# Patient Record
Sex: Male | Born: 1965
Health system: Southern US, Community
[De-identification: ages and names within clinical notes are randomized; demographics above are authoritative.]

## PROBLEM LIST (undated history)

## (undated) HISTORY — PX: HERNIA REPAIR: SHX51

## (undated) HISTORY — PX: APPENDECTOMY: SHX54

---

## 1997-09-19 ENCOUNTER — Emergency Department (HOSPITAL_COMMUNITY): Admission: EM | Admit: 1997-09-19 | Discharge: 1997-09-19 | Payer: Self-pay | Admitting: Emergency Medicine

## 1998-08-03 ENCOUNTER — Ambulatory Visit (HOSPITAL_BASED_OUTPATIENT_CLINIC_OR_DEPARTMENT_OTHER): Admission: RE | Admit: 1998-08-03 | Discharge: 1998-08-03 | Payer: Self-pay | Admitting: Orthopedic Surgery

## 2004-04-14 ENCOUNTER — Emergency Department (HOSPITAL_COMMUNITY): Admission: EM | Admit: 2004-04-14 | Discharge: 2004-04-14 | Payer: Self-pay | Admitting: Emergency Medicine

## 2007-02-10 ENCOUNTER — Emergency Department (HOSPITAL_COMMUNITY): Admission: EM | Admit: 2007-02-10 | Discharge: 2007-02-10 | Payer: Self-pay | Admitting: Emergency Medicine

## 2007-04-29 ENCOUNTER — Emergency Department (HOSPITAL_COMMUNITY): Admission: EM | Admit: 2007-04-29 | Discharge: 2007-04-29 | Payer: Self-pay | Admitting: Emergency Medicine

## 2008-10-18 ENCOUNTER — Emergency Department (HOSPITAL_COMMUNITY): Admission: EM | Admit: 2008-10-18 | Discharge: 2008-10-18 | Payer: Self-pay | Admitting: Emergency Medicine

## 2009-09-24 ENCOUNTER — Encounter: Admission: RE | Admit: 2009-09-24 | Discharge: 2009-09-24 | Payer: Self-pay | Admitting: Chiropractic Medicine

## 2010-03-06 ENCOUNTER — Emergency Department (HOSPITAL_COMMUNITY)
Admission: EM | Admit: 2010-03-06 | Discharge: 2010-03-06 | Payer: Self-pay | Source: Home / Self Care | Admitting: Emergency Medicine

## 2010-03-08 ENCOUNTER — Emergency Department (HOSPITAL_COMMUNITY)
Admission: EM | Admit: 2010-03-08 | Discharge: 2010-03-09 | Payer: Self-pay | Source: Home / Self Care | Admitting: Emergency Medicine

## 2010-05-30 LAB — URINALYSIS, ROUTINE W REFLEX MICROSCOPIC
Glucose, UA: NEGATIVE mg/dL
Glucose, UA: NEGATIVE mg/dL
Ketones, ur: NEGATIVE mg/dL
Leukocytes, UA: NEGATIVE
Leukocytes, UA: NEGATIVE
Protein, ur: NEGATIVE mg/dL
Specific Gravity, Urine: 1.026 (ref 1.005–1.030)
Urobilinogen, UA: 0.2 mg/dL (ref 0.0–1.0)
pH: 5.5 (ref 5.0–8.0)

## 2010-05-30 LAB — POCT I-STAT, CHEM 8
BUN: 13 mg/dL (ref 6–23)
Calcium, Ion: 1.18 mmol/L (ref 1.12–1.32)
HCT: 40 % (ref 39.0–52.0)
Hemoglobin: 13.6 g/dL (ref 13.0–17.0)
Hemoglobin: 13.9 g/dL (ref 13.0–17.0)
Potassium: 3.9 mEq/L (ref 3.5–5.1)
Sodium: 137 mEq/L (ref 135–145)
Sodium: 141 mEq/L (ref 135–145)
TCO2: 28 mmol/L (ref 0–100)

## 2010-05-30 LAB — DIFFERENTIAL
Basophils Relative: 0 % (ref 0–1)
Eosinophils Absolute: 0.1 10*3/uL (ref 0.0–0.7)
Neutrophils Relative %: 74 % (ref 43–77)

## 2010-05-30 LAB — CBC
MCH: 29.6 pg (ref 26.0–34.0)
MCHC: 34.4 g/dL (ref 30.0–36.0)
Platelets: 154 10*3/uL (ref 150–400)
RBC: 4.52 MIL/uL (ref 4.22–5.81)

## 2010-05-30 LAB — URINE CULTURE
Colony Count: NO GROWTH
Culture: NO GROWTH

## 2010-05-30 LAB — URINE MICROSCOPIC-ADD ON

## 2010-12-09 LAB — CBC
HCT: 42
Hemoglobin: 14.2
Platelets: 216
WBC: 7.3

## 2010-12-09 LAB — BASIC METABOLIC PANEL
GFR calc Af Amer: >60
GFR calc non Af Amer: >60
Potassium: 3.9
Sodium: 140

## 2010-12-09 LAB — DIFFERENTIAL
Eosinophils Relative: 2
Lymphocytes Relative: 33
Lymphs Abs: 2.4
Monocytes Absolute: 0.7

## 2017-09-18 DIAGNOSIS — M545 Low back pain: Secondary | ICD-10-CM | POA: Diagnosis not present

## 2017-11-21 DIAGNOSIS — Z0000 Encounter for general adult medical examination without abnormal findings: Secondary | ICD-10-CM | POA: Diagnosis not present

## 2017-11-21 DIAGNOSIS — Z125 Encounter for screening for malignant neoplasm of prostate: Secondary | ICD-10-CM | POA: Diagnosis not present

## 2017-11-21 DIAGNOSIS — R5383 Other fatigue: Secondary | ICD-10-CM | POA: Diagnosis not present

## 2018-01-18 DIAGNOSIS — H5203 Hypermetropia, bilateral: Secondary | ICD-10-CM | POA: Diagnosis not present

## 2018-01-18 DIAGNOSIS — H0100B Unspecified blepharitis left eye, upper and lower eyelids: Secondary | ICD-10-CM | POA: Diagnosis not present

## 2018-01-18 DIAGNOSIS — H524 Presbyopia: Secondary | ICD-10-CM | POA: Diagnosis not present

## 2018-01-18 DIAGNOSIS — H0100A Unspecified blepharitis right eye, upper and lower eyelids: Secondary | ICD-10-CM | POA: Diagnosis not present

## 2018-01-24 DIAGNOSIS — M79671 Pain in right foot: Secondary | ICD-10-CM | POA: Diagnosis not present

## 2018-01-24 DIAGNOSIS — M79672 Pain in left foot: Secondary | ICD-10-CM | POA: Diagnosis not present

## 2018-01-24 DIAGNOSIS — M722 Plantar fascial fibromatosis: Secondary | ICD-10-CM | POA: Diagnosis not present

## 2018-05-28 DIAGNOSIS — Z1211 Encounter for screening for malignant neoplasm of colon: Secondary | ICD-10-CM | POA: Diagnosis not present

## 2018-05-28 DIAGNOSIS — E669 Obesity, unspecified: Secondary | ICD-10-CM | POA: Diagnosis not present

## 2018-05-28 DIAGNOSIS — Z800 Family history of malignant neoplasm of digestive organs: Secondary | ICD-10-CM | POA: Diagnosis not present

## 2018-06-07 DIAGNOSIS — K5140 Inflammatory polyps of colon without complications: Secondary | ICD-10-CM | POA: Diagnosis not present

## 2018-06-07 DIAGNOSIS — D120 Benign neoplasm of cecum: Secondary | ICD-10-CM | POA: Diagnosis not present

## 2018-06-07 DIAGNOSIS — K635 Polyp of colon: Secondary | ICD-10-CM | POA: Diagnosis not present

## 2018-06-07 DIAGNOSIS — Z1211 Encounter for screening for malignant neoplasm of colon: Secondary | ICD-10-CM | POA: Diagnosis not present

## 2018-06-07 DIAGNOSIS — K5730 Diverticulosis of large intestine without perforation or abscess without bleeding: Secondary | ICD-10-CM | POA: Diagnosis not present

## 2018-09-11 DIAGNOSIS — M545 Low back pain: Secondary | ICD-10-CM | POA: Diagnosis not present

## 2018-09-11 DIAGNOSIS — M62830 Muscle spasm of back: Secondary | ICD-10-CM | POA: Diagnosis not present

## 2018-09-11 DIAGNOSIS — M6281 Muscle weakness (generalized): Secondary | ICD-10-CM | POA: Diagnosis not present

## 2018-09-12 DIAGNOSIS — M6281 Muscle weakness (generalized): Secondary | ICD-10-CM | POA: Diagnosis not present

## 2018-09-12 DIAGNOSIS — M62830 Muscle spasm of back: Secondary | ICD-10-CM | POA: Diagnosis not present

## 2018-09-12 DIAGNOSIS — M545 Low back pain: Secondary | ICD-10-CM | POA: Diagnosis not present

## 2018-09-13 DIAGNOSIS — M6281 Muscle weakness (generalized): Secondary | ICD-10-CM | POA: Diagnosis not present

## 2018-09-13 DIAGNOSIS — M545 Low back pain: Secondary | ICD-10-CM | POA: Diagnosis not present

## 2018-09-13 DIAGNOSIS — M62830 Muscle spasm of back: Secondary | ICD-10-CM | POA: Diagnosis not present

## 2018-09-16 DIAGNOSIS — M62830 Muscle spasm of back: Secondary | ICD-10-CM | POA: Diagnosis not present

## 2018-09-16 DIAGNOSIS — M6281 Muscle weakness (generalized): Secondary | ICD-10-CM | POA: Diagnosis not present

## 2018-09-16 DIAGNOSIS — M545 Low back pain: Secondary | ICD-10-CM | POA: Diagnosis not present

## 2018-09-24 DIAGNOSIS — M545 Low back pain: Secondary | ICD-10-CM | POA: Diagnosis not present

## 2018-09-24 DIAGNOSIS — M62830 Muscle spasm of back: Secondary | ICD-10-CM | POA: Diagnosis not present

## 2018-09-24 DIAGNOSIS — M6281 Muscle weakness (generalized): Secondary | ICD-10-CM | POA: Diagnosis not present

## 2018-11-28 DIAGNOSIS — Z23 Encounter for immunization: Secondary | ICD-10-CM | POA: Diagnosis not present

## 2018-11-28 DIAGNOSIS — E782 Mixed hyperlipidemia: Secondary | ICD-10-CM | POA: Diagnosis not present

## 2018-11-28 DIAGNOSIS — Z0000 Encounter for general adult medical examination without abnormal findings: Secondary | ICD-10-CM | POA: Diagnosis not present

## 2018-11-28 DIAGNOSIS — Z125 Encounter for screening for malignant neoplasm of prostate: Secondary | ICD-10-CM | POA: Diagnosis not present

## 2018-12-10 DIAGNOSIS — G43019 Migraine without aura, intractable, without status migrainosus: Secondary | ICD-10-CM | POA: Diagnosis not present

## 2018-12-10 DIAGNOSIS — G43719 Chronic migraine without aura, intractable, without status migrainosus: Secondary | ICD-10-CM | POA: Diagnosis not present

## 2018-12-25 ENCOUNTER — Encounter: Payer: Self-pay | Admitting: Pulmonary Disease

## 2018-12-25 ENCOUNTER — Ambulatory Visit (INDEPENDENT_AMBULATORY_CARE_PROVIDER_SITE_OTHER): Payer: BC Managed Care – PPO | Admitting: Pulmonary Disease

## 2018-12-25 ENCOUNTER — Other Ambulatory Visit: Payer: Self-pay

## 2018-12-25 VITALS — BP 128/70 | HR 83 | Ht 68.0 in | Wt 199.6 lb

## 2018-12-25 DIAGNOSIS — G4733 Obstructive sleep apnea (adult) (pediatric): Secondary | ICD-10-CM | POA: Diagnosis not present

## 2018-12-25 DIAGNOSIS — Z9989 Dependence on other enabling machines and devices: Secondary | ICD-10-CM

## 2018-12-25 NOTE — Progress Notes (Signed)
Subjective:     Patient ID: Dale Colon, male   DOB: August 21, 1965, 53 y.o.   MRN: [id number]  Patient being seen for obstructive sleep apnea  Patient had a sleep study done over 10 years ago Showing mild obstructive sleep apnea, was started on CPAP therapy Pressure was adjusted to his comfort  Ultimately patient ended up on an auto titrating machine which was better tolerated His current machine is at least 54 years old  We do not have compliance data on this  Sleep is nonrestorative Goes to bed between 930 and 11:30 PM Takes him about 30 minutes to fall asleep Wakes up about once or twice during the night Final awakening time between 630 and 8 AM  Sleep is nonrestorative  He still does have occasional headaches but overall better  He feels he does move around a lot in bed He has a dog that you are doing well recently-this may contribute to nocturnal awakenings    Review of Systems  Constitutional: Negative for fever and unexpected weight change.  HENT: Negative for congestion, dental problem, ear pain, nosebleeds, postnasal drip, rhinorrhea, sinus pressure, sneezing, sore throat and trouble swallowing.   Eyes: Negative for redness and itching.  Respiratory: Negative for cough, chest tightness, shortness of breath and wheezing.   Cardiovascular: Negative for palpitations and leg swelling.  Gastrointestinal: Negative for nausea and vomiting.  Genitourinary: Negative for dysuria.  Musculoskeletal: Negative for joint swelling.  Skin: Negative for rash.  Allergic/Immunologic: Positive for environmental allergies. Negative for food allergies and immunocompromised state.  Neurological: Positive for headaches.  Hematological: Does not bruise/bleed easily.  Psychiatric/Behavioral: Negative for dysphoric mood. The patient is not nervous/anxious.    Medical history significant for chronic headaches  No significant family history  Never smoker  No significant family  history    Objective:   Physical Exam Constitutional:      Appearance: Normal appearance.  HENT:     Head: Normocephalic and atraumatic.     Mouth/Throat:     Pharynx: No oropharyngeal exudate.  Eyes:     General:        Right eye: No discharge.        Left eye: No discharge.     Pupils: Pupils are equal, round, and reactive to light.  Neck:     Musculoskeletal: Normal range of motion and neck supple.  Cardiovascular:     Rate and Rhythm: Normal rate and regular rhythm.     Pulses: Normal pulses.     Heart sounds: Normal heart sounds.  Pulmonary:     Effort: Pulmonary effort is normal. No respiratory distress.     Breath sounds: Normal breath sounds. No stridor. No wheezing or rhonchi.  Abdominal:     General: There is no distension.  Musculoskeletal: Normal range of motion.        General: No swelling or tenderness.  Skin:    General: Skin is dry.  Neurological:     General: No focal deficit present.     Mental Status: He is alert.    Vitals:   12/25/18 1035  BP: 128/70  Pulse: 83  SpO2: 98%   Results of the Epworth flowsheet 12/25/2018  Sitting and reading 1  Watching TV 1  Sitting, inactive in a public place (e.g. a theatre or a meeting) 0  As a passenger in a car for an hour without a break 0  Lying down to rest in the afternoon when circumstances permit 1  Sitting  and talking to someone 0  Sitting quietly after a lunch without alcohol 1  In a car, while stopped for a few minutes in traffic 0  Total score 4       Assessment:     Obstructive sleep apnea -We were able to get his old sleep study showing mild obstructive sleep apnea -He continues to use CPAP  Nonrestorative sleep  CPAP supplies issues    Plan:     We will get more information to see how he can get CPAP supplies from a DME  He feels the machine works well at the present time, some mask issues may need resolved  To bring by his smart card-to assess compliance  Trial with  melatonin-may help  I will see him back in about 4 to 6 weeks  He may have to get CPAP of supplies online  Encouraged to call with significant concerns

## 2018-12-25 NOTE — Patient Instructions (Signed)
Obstructive sleep apnea  We will try and find more information of how we can get you CPAP supplies If you can, kindly bring [place] your smart card from your machine-to try and get some information from you to make sure it is working well  I will tentatively see you back in 3 months  We will look for information that may be able to help, so as not to repeat a study  We will contact the headache clinic to see whether they have copies of your sleep studies  Call with significant concerns

## 2019-01-20 ENCOUNTER — Telehealth: Payer: Self-pay | Admitting: Pulmonary Disease

## 2019-01-20 DIAGNOSIS — Z9989 Dependence on other enabling machines and devices: Secondary | ICD-10-CM

## 2019-01-20 DIAGNOSIS — G4733 Obstructive sleep apnea (adult) (pediatric): Secondary | ICD-10-CM

## 2019-01-20 NOTE — Telephone Encounter (Signed)
lmtcb for pt. We do not have a DME company on file, we need to figure out what DME company he uses before we can order any cpap supplies.

## 2019-01-20 NOTE — Telephone Encounter (Signed)
I looked and there is not a order for supplies placed

## 2019-01-21 NOTE — Telephone Encounter (Signed)
Sorry I didn't realize the order has already been placed I have a copy of the sleep study

## 2019-01-21 NOTE — Telephone Encounter (Signed)
Spoke with pt, he states he was getting his CPAP supplies from [facility] but they no longer give orders for CPAP supplies. He would like for [place] to choose a DME company for him to receive supplies. He has paid off his CPAP machine and would like someone in network with his insurance. I will place an order for supplies. FYI [facility]

## 2019-01-21 NOTE — Telephone Encounter (Signed)
I spoke with Dale Colon with [facility] on 10/07 and they stated that as long as they have the sleep study they can help with the Cpap supplies. If you put the order in to renew Cpap supplies it can be sent to [facility]

## 2019-01-21 NOTE — Telephone Encounter (Signed)
Order has been placed and sent to [facility] and I have faxed the sleep studies that we had

## 2019-01-24 DIAGNOSIS — H5203 Hypermetropia, bilateral: Secondary | ICD-10-CM | POA: Diagnosis not present

## 2019-01-24 DIAGNOSIS — H524 Presbyopia: Secondary | ICD-10-CM | POA: Diagnosis not present

## 2019-01-24 DIAGNOSIS — H40053 Ocular hypertension, bilateral: Secondary | ICD-10-CM | POA: Diagnosis not present

## 2019-01-24 DIAGNOSIS — H52203 Unspecified astigmatism, bilateral: Secondary | ICD-10-CM | POA: Diagnosis not present

## 2019-02-07 DIAGNOSIS — G4733 Obstructive sleep apnea (adult) (pediatric): Secondary | ICD-10-CM | POA: Diagnosis not present

## 2019-02-11 DIAGNOSIS — G4733 Obstructive sleep apnea (adult) (pediatric): Secondary | ICD-10-CM | POA: Diagnosis not present

## 2019-03-17 ENCOUNTER — Ambulatory Visit: Payer: BC Managed Care – PPO | Admitting: Pulmonary Disease

## 2019-05-17 ENCOUNTER — Emergency Department (HOSPITAL_COMMUNITY): Payer: BC Managed Care – PPO

## 2019-05-17 ENCOUNTER — Other Ambulatory Visit: Payer: Self-pay

## 2019-05-17 ENCOUNTER — Emergency Department (HOSPITAL_COMMUNITY)
Admission: EM | Admit: 2019-05-17 | Discharge: 2019-05-17 | Disposition: A | Discharge status: Home / Self Care | Payer: BC Managed Care – PPO | Attending: Emergency Medicine | Admitting: Emergency Medicine

## 2019-05-17 DIAGNOSIS — N201 Calculus of ureter: Secondary | ICD-10-CM | POA: Insufficient documentation

## 2019-05-17 DIAGNOSIS — R109 Unspecified abdominal pain: Secondary | ICD-10-CM

## 2019-05-17 DIAGNOSIS — N132 Hydronephrosis with renal and ureteral calculous obstruction: Secondary | ICD-10-CM | POA: Diagnosis not present

## 2019-05-17 DIAGNOSIS — R1031 Right lower quadrant pain: Secondary | ICD-10-CM | POA: Diagnosis not present

## 2019-05-17 LAB — CBC
HCT: 46.2 % (ref 39.0–52.0)
Hemoglobin: 15.3 g/dL (ref 13.0–17.0)
MCH: 28.5 pg (ref 26.0–34.0)
MCHC: 33.1 g/dL (ref 30.0–36.0)
MCV: 86.2 fL (ref 80.0–100.0)
Platelets: 238 10*3/uL (ref 150–400)
RBC: 5.36 MIL/uL (ref 4.22–5.81)
RDW: 13.3 % (ref 11.5–15.5)
WBC: 13.6 10*3/uL — ABNORMAL HIGH (ref 4.0–10.5)
nRBC: 0 % (ref 0.0–0.2)

## 2019-05-17 LAB — URINALYSIS, ROUTINE W REFLEX MICROSCOPIC
Bacteria, UA: NONE SEEN
Bilirubin Urine: NEGATIVE
Glucose, UA: NEGATIVE mg/dL
Ketones, ur: 5 mg/dL — AB
Leukocytes,Ua: NEGATIVE
Nitrite: NEGATIVE
Protein, ur: NEGATIVE mg/dL
RBC / HPF: >50 RBC/hpf — ABNORMAL HIGH (ref 0–5)
Specific Gravity, Urine: 1.026 (ref 1.005–1.030)
pH: 5 (ref 5.0–8.0)

## 2019-05-17 LAB — COMPREHENSIVE METABOLIC PANEL
ALT: 31 U/L (ref 0–44)
AST: 29 U/L (ref 15–41)
Albumin: 4.6 g/dL (ref 3.5–5.0)
Alkaline Phosphatase: 78 U/L (ref 38–126)
Anion gap: 11 (ref 5–15)
BUN: 18 mg/dL (ref 6–20)
CO2: 24 mmol/L (ref 22–32)
Calcium: 9.6 mg/dL (ref 8.9–10.3)
Chloride: 105 mmol/L (ref 98–111)
Creatinine, Ser: 1.5 mg/dL — ABNORMAL HIGH (ref 0.61–1.24)
GFR calc Af Amer: >60 mL/min (ref >60)
GFR calc non Af Amer: 52 mL/min — ABNORMAL LOW (ref >60)
Glucose, Bld: 109 mg/dL — ABNORMAL HIGH (ref 70–99)
Potassium: 3.6 mmol/L (ref 3.5–5.1)
Sodium: 140 mmol/L (ref 135–145)
Total Bilirubin: 0.6 mg/dL (ref 0.3–1.2)
Total Protein: 7.5 g/dL (ref 6.5–8.1)

## 2019-05-17 LAB — LIPASE, BLOOD: Lipase: 32 U/L (ref 11–51)

## 2019-05-17 MED ORDER — OXYCODONE-ACETAMINOPHEN 5-325 MG PO TABS
1.0000 | ORAL_TABLET | ORAL | Status: AC | PRN
  Administered 2019-05-17 (×2): 1 via ORAL
  Filled 2019-05-17 (×2): qty 1

## 2019-05-17 MED ORDER — KETOROLAC TROMETHAMINE 30 MG/ML IJ SOLN
15.0000 mg | Freq: Once | INTRAMUSCULAR | Status: AC
  Administered 2019-05-17: 15 mg via INTRAVENOUS
  Filled 2019-05-17: qty 1

## 2019-05-17 MED ORDER — TAMSULOSIN HCL 0.4 MG PO CAPS: 0.4000 mg | ORAL_CAPSULE | Freq: Every day | ORAL | 0 refills | Status: AC

## 2019-05-17 MED ORDER — KETOROLAC TROMETHAMINE 30 MG/ML IJ SOLN
30.0000 mg | Freq: Once | INTRAMUSCULAR | Status: AC
  Administered 2019-05-17: 30 mg via INTRAVENOUS
  Filled 2019-05-17: qty 1

## 2019-05-17 MED ORDER — ONDANSETRON 4 MG PO TBDP
4.0000 mg | ORAL_TABLET | Freq: Once | ORAL | Status: AC | PRN
  Administered 2019-05-17: 4 mg via ORAL
  Filled 2019-05-17: qty 1

## 2019-05-17 MED ORDER — KETOROLAC TROMETHAMINE 10 MG PO TABS: 10.0000 mg | ORAL_TABLET | Freq: Four times a day (QID) | ORAL | 0 refills | Status: AC | PRN

## 2019-05-17 NOTE — ED Provider Notes (Signed)
Emergency Department Provider Note   I have reviewed the triage vital signs and the nursing notes.   HISTORY  Chief Complaint Flank Pain   HPI Dale Colon is a 54 y.o. male with PMH of kidney stones presents to the ED with sudden onset, severe pain in the right flank.  Symptoms began within the last couple of hours.  Pain is in the lower back and radiating around to the right abdomen.  He denies any fevers or chills. He is not on blood thinners. No radiation of symptoms or modifying factors.    No past medical history on file.  There are no problems to display for this patient.   Past Surgical History:  Procedure Laterality Date  . APPENDECTOMY     1978  . HERNIA REPAIR     age 39 or 3    Allergies Patient has no known allergies.  Family History  Problem Relation Age of Onset  . Ovarian cancer Mother   . Colon cancer Maternal Grandfather     Social History Social History   Tobacco Use  . Smoking status: Never Smoker  . Smokeless tobacco: Never Used  Substance Use Topics  . Alcohol use: Not on file  . Drug use: Not on file    Review of Systems  Constitutional: No fever/chills Eyes: No visual changes. ENT: No sore throat. Cardiovascular: Denies chest pain. Respiratory: Denies shortness of breath. Gastrointestinal: Severe right flank/abdominal pain.  No nausea, no vomiting.  No diarrhea.  No constipation. Genitourinary: Negative for dysuria. Musculoskeletal: Negative for back pain. Skin: Negative for rash. Neurological: Negative for headaches, focal weakness or numbness.  10-point ROS otherwise negative.  ____________________________________________   PHYSICAL EXAM:  VITAL SIGNS: ED Triage Vitals  Enc Vitals Group     BP 05/17/19 1825 (!) 163/97     Pulse Rate 05/17/19 1825 87     Resp 05/17/19 1825 16     Temp 05/17/19 1825 98.1 F (36.7 C)     Temp Source 05/17/19 1825 Oral     SpO2 05/17/19 1825 99 %     Weight 05/17/19 1845 192  lb (87.1 kg)     Height 05/17/19 1845 5\' 8"  (1.727 m)   Constitutional: Alert with frequent shifting in bed. Appears very uncomfortable.  Eyes: Conjunctivae are normal.  Head: Atraumatic. Nose: No congestion/rhinnorhea. Mouth/Throat: Mucous membranes are moist.  Neck: No stridor.   Cardiovascular: Normal rate, regular rhythm. Good peripheral circulation. Grossly normal heart sounds.   Respiratory: Normal respiratory effort.  No retractions. Lungs CTAB. Gastrointestinal: Soft and nontender. No distention.  Musculoskeletal: No gross deformities of extremities. Neurologic:  Normal speech and language.  Skin:  Skin is warm, dry and intact. No rash noted.  ____________________________________________   LABS (all labs ordered are listed, but only abnormal results are displayed)  Labs Reviewed  URINALYSIS, ROUTINE W REFLEX MICROSCOPIC - Abnormal; Notable for the following components:      Result Value   Hgb urine dipstick MODERATE (*)    Ketones, ur 5 (*)    RBC / HPF >50 (*)    All other components within normal limits  COMPREHENSIVE METABOLIC PANEL - Abnormal; Notable for the following components:   Glucose, Bld 109 (*)    Creatinine, Ser 1.50 (*)    GFR calc non Af Amer 52 (*)    All other components within normal limits  CBC - Abnormal; Notable for the following components:   WBC 13.6 (*)    All  other components within normal limits  LIPASE, BLOOD   ____________________________________________  RADIOLOGY  CT Renal Stone Study  Result Date: 05/17/2019 CLINICAL DATA:  Right-sided abdominal pain EXAM: CT ABDOMEN AND PELVIS WITHOUT CONTRAST TECHNIQUE: Multidetector CT imaging of the abdomen and pelvis was performed following the standard protocol without IV contrast. COMPARISON:  None. FINDINGS: Lower chest: The visualized heart size within normal limits. No pericardial fluid/thickening. No hiatal hernia. The visualized portions of the lungs are clear. Hepatobiliary: Although  limited due to the lack of intravenous contrast, normal in appearance without gross focal abnormality. No evidence of calcified gallstones or biliary ductal dilatation. Pancreas:  Unremarkable.  No surrounding inflammatory changes. Spleen: Normal in size. Although limited due to the lack of intravenous contrast, normal in appearance. Adrenals/Urinary Tract: Both adrenal glands appear normal. There is mild right pelvicaliectasis and ureterectasis with significant perinephric and periureteral stranding down to the level of posterior right bladder where there is a 4 mm calculus present. There is also a punctate calcifications seen in the upper pole of the right kidney. No left-sided renal or ureteral calculi are seen. Stomach/Bowel: The stomach, small bowel, and colon are normal in appearance. No inflammatory changes or obstructive findings. appendix is normal. Vascular/Lymphatic: There are no enlarged abdominal or pelvic lymph nodes. No significant gross vascular findings are present. Reproductive: The prostate is unremarkable. Other: No evidence of abdominal wall mass or hernia. Musculoskeletal: No acute or significant osseous findings. IMPRESSION: Mild right hydronephrosis with significant perinephric and periureteral stranding to the level of the posterior right bladder there is a 4 mm calculus. Punctate right upper pole renal calculus. Electronically Signed   By: Prudencio Pair M.D.   On: 05/17/2019 22:09    ____________________________________________   PROCEDURES  Procedure(s) performed:   Procedures  None ____________________________________________   INITIAL IMPRESSION / ASSESSMENT AND PLAN / ED COURSE  Pertinent labs & imaging results that were available during my care of the patient were reviewed by me and considered in my medical decision making (see chart for details).   Patient presents to the emergency department for evaluation of acute onset right flank pain.  His presentation seems  most consistent for kidney stone.  He has blood in the urine without evidence of infection.  With right sided pain I have very low suspicion for vascular pathology. Plan for labs and CT renal after Toradol.   CT with 4 mm stone noted without complications. No fever. No UTI. Plan for pain control at home and PCP/Urology follow up.  ____________________________________________  FINAL CLINICAL IMPRESSION(S) / ED DIAGNOSES  Final diagnoses:  Ureterolithiasis  Right flank pain     MEDICATIONS GIVEN DURING THIS VISIT:  Medications  oxyCODONE-acetaminophen (PERCOCET/ROXICET) 5-325 MG per tablet 1 tablet (1 tablet Oral Given 05/17/19 2032)  ondansetron (ZOFRAN-ODT) disintegrating tablet 4 mg (4 mg Oral Given 05/17/19 1944)  ketorolac (TORADOL) 30 MG/ML injection 30 mg (30 mg Intravenous Given 05/17/19 2032)  ketorolac (TORADOL) 30 MG/ML injection 15 mg (15 mg Intravenous Given 05/17/19 2242)     NEW OUTPATIENT MEDICATIONS STARTED DURING THIS VISIT:  Discharge Medication List as of 05/18/2019 10:44 AM    START taking these medications   Details  ketorolac (TORADOL) 10 MG tablet Take 1 tablet (10 mg total) by mouth every 6 (six) hours as needed for moderate pain., Starting Sat 05/17/2019, Normal    tamsulosin (FLOMAX) 0.4 MG CAPS capsule Take 1 capsule (0.4 mg total) by mouth daily., Starting Sat 05/17/2019, Normal  Note:  This document was prepared using Dragon voice recognition software and may include unintentional dictation errors.  Alona Bene, MD, [facility] Emergency Medicine    Moustapha Tooker, Arlyss Repress, MD 05/18/19 1345

## 2019-05-17 NOTE — ED Notes (Signed)
Due to pain and wait Percocet given by protocol

## 2019-05-17 NOTE — ED Triage Notes (Signed)
History of kidney stones, pain started an hours ago , lower back to abd on rt side.

## 2019-05-17 NOTE — Discharge Instructions (Signed)
You have been seen in the Emergency Department (ED) today for pain that we believe based on your workup, is caused by kidney stones.  As we have discussed, please drink plenty of fluids.  Please make a follow up appointment with the physician(s) listed elsewhere in this documentation.  You may take pain medication as needed but ONLY as prescribed.  Please also take your prescribed Flomax daily.  We also recommend that you take over-the-counter ibuprofen regularly according to label instructions over the next 5 days.  Take it with meals to minimize stomach discomfort.  Please see your doctor as soon as possible as stones may take 1-3 weeks to pass and you may require additional care or medications.  Do not drink alcohol, drive or participate in any other potentially dangerous activities while taking opiate pain medication as it may make you sleepy. Do not take this medication with any other sedating medications, either prescription or over-the-counter.    Take Percocet as needed for severe pain.  This medication is an opiate (or narcotic) pain medication and can be habit forming.  Use it as little as possible to achieve adequate pain control.  Do not use or use it with extreme caution if you have a history of opiate abuse or dependence. This medication is intended for your use only - do not give any to anyone else and keep it in a secure place where nobody else, especially children, have access to it.  It will also cause or worsen constipation, so you may want to consider taking an over-the-counter stool softener while you are taking this medication.  Return to the Emergency Department (ED) or call your doctor if you have any worsening pain, fever, painful urination, are unable to urinate, or develop other symptoms that concern you.    Kidney Stones Kidney stones (urolithiasis) are deposits that form inside your kidneys. The intense pain is caused by the stone moving through the urinary tract. When the  stone moves, the ureter goes into spasm around the stone. The stone is usually passed in the urine.  CAUSES  A disorder that makes certain neck glands produce too much parathyroid hormone (primary hyperparathyroidism). A buildup of uric acid crystals, similar to gout in your joints. Narrowing (stricture) of the ureter. A kidney obstruction present at birth (congenital obstruction). Previous surgery on the kidney or ureters. Numerous kidney infections. SYMPTOMS  Feeling sick to your stomach (nauseous). Throwing up (vomiting). Blood in the urine (hematuria). Pain that usually spreads (radiates) to the groin. Frequency or urgency of urination. DIAGNOSIS  Taking a history and physical exam. Blood or urine tests. CT scan. Occasionally, an examination of the inside of the urinary bladder (cystoscopy) is performed. TREATMENT  Observation. Increasing your fluid intake. Extracorporeal shock wave lithotripsy--This is a noninvasive procedure that uses shock waves to break up kidney stones. Surgery may be needed if you have severe pain or persistent obstruction. There are various surgical procedures. Most of the procedures are performed with the use of small instruments. Only small incisions are needed to accommodate these instruments, so recovery time is minimized. The size, location, and chemical composition are all important variables that will determine the proper choice of action for you. Talk to your health care provider to better understand your situation so that you will minimize the risk of injury to yourself and your kidney.  HOME CARE INSTRUCTIONS  Drink enough water and fluids to keep your urine clear or pale yellow. This will help you to pass the  stone or stone fragments. Strain all urine through the provided strainer. Keep all particulate matter and stones for your health care provider to see. The stone causing the pain may be as small as a grain of salt. It is very important to use the  strainer each and every time you pass your urine. The collection of your stone will allow your health care provider to analyze it and verify that a stone has actually passed. The stone analysis will often identify what you can do to reduce the incidence of recurrences. Only take over-the-counter or prescription medicines for pain, discomfort, or fever as directed by your health care provider. Keep all follow-up visits as told by your health care provider. This is important. Get follow-up X-rays if required. The absence of pain does not always mean that the stone has passed. It may have only stopped moving. If the urine remains completely obstructed, it can cause loss of kidney function or even complete destruction of the kidney. It is your responsibility to make sure X-rays and follow-ups are completed. Ultrasounds of the kidney can show blockages and the status of the kidney. Ultrasounds are not associated with any radiation and can be performed easily in a matter of minutes. Make changes to your daily diet as told by your health care provider. You may be told to: Limit the amount of salt that you eat. Eat 5 or more servings of fruits and vegetables each day. Limit the amount of meat, poultry, fish, and eggs that you eat. Collect a 24-hour urine sample as told by your health care provider. You may need to collect another urine sample every 6-12 months. SEEK MEDICAL CARE IF: You experience pain that is progressive and unresponsive to any pain medicine you have been prescribed. SEEK IMMEDIATE MEDICAL CARE IF:  Pain cannot be controlled with the prescribed medicine. You have a fever or shaking chills. The severity or intensity of pain increases over 18 hours and is not relieved by pain medicine. You develop a new onset of abdominal pain. You feel faint or pass out. You are unable to urinate.   This information is not intended to replace advice given to you by your health care provider. Make sure you  discuss any questions you have with your health care provider.   Document Released: 03/06/2005 Document Revised: 11/25/2014 Document Reviewed: 08/07/2012 Elsevier Interactive Patient Education [employer].

## 2019-05-17 NOTE — ED Notes (Signed)
Patient transported to CT 

## 2019-07-08 DIAGNOSIS — L570 Actinic keratosis: Secondary | ICD-10-CM | POA: Diagnosis not present

## 2019-07-08 DIAGNOSIS — D485 Neoplasm of uncertain behavior of skin: Secondary | ICD-10-CM | POA: Diagnosis not present

## 2019-08-19 DIAGNOSIS — Z125 Encounter for screening for malignant neoplasm of prostate: Secondary | ICD-10-CM | POA: Diagnosis not present

## 2019-08-19 DIAGNOSIS — Z0000 Encounter for general adult medical examination without abnormal findings: Secondary | ICD-10-CM | POA: Diagnosis not present

## 2019-08-19 DIAGNOSIS — E782 Mixed hyperlipidemia: Secondary | ICD-10-CM | POA: Diagnosis not present

## 2019-12-15 DIAGNOSIS — S61411A Laceration without foreign body of right hand, initial encounter: Secondary | ICD-10-CM | POA: Diagnosis not present

## 2019-12-26 DIAGNOSIS — S61210D Laceration without foreign body of right index finger without damage to nail, subsequent encounter: Secondary | ICD-10-CM | POA: Diagnosis not present

## 2019-12-26 DIAGNOSIS — Z23 Encounter for immunization: Secondary | ICD-10-CM | POA: Diagnosis not present

## 2019-12-26 DIAGNOSIS — Z4802 Encounter for removal of sutures: Secondary | ICD-10-CM | POA: Diagnosis not present

## 2020-03-18 DIAGNOSIS — H04123 Dry eye syndrome of bilateral lacrimal glands: Secondary | ICD-10-CM | POA: Diagnosis not present

## 2020-03-18 DIAGNOSIS — H5203 Hypermetropia, bilateral: Secondary | ICD-10-CM | POA: Diagnosis not present

## 2020-07-07 DIAGNOSIS — S30861A Insect bite (nonvenomous) of abdominal wall, initial encounter: Secondary | ICD-10-CM | POA: Diagnosis not present

## 2020-07-07 DIAGNOSIS — W57XXXA Bitten or stung by nonvenomous insect and other nonvenomous arthropods, initial encounter: Secondary | ICD-10-CM | POA: Diagnosis not present

## 2020-08-30 DIAGNOSIS — Z125 Encounter for screening for malignant neoplasm of prostate: Secondary | ICD-10-CM | POA: Diagnosis not present

## 2020-08-30 DIAGNOSIS — E782 Mixed hyperlipidemia: Secondary | ICD-10-CM | POA: Diagnosis not present

## 2020-08-30 DIAGNOSIS — Z0000 Encounter for general adult medical examination without abnormal findings: Secondary | ICD-10-CM | POA: Diagnosis not present

## 2020-10-11 DIAGNOSIS — G4733 Obstructive sleep apnea (adult) (pediatric): Secondary | ICD-10-CM | POA: Diagnosis not present

## 2020-10-15 DIAGNOSIS — G4733 Obstructive sleep apnea (adult) (pediatric): Secondary | ICD-10-CM | POA: Diagnosis not present

## 2020-12-28 DIAGNOSIS — G4733 Obstructive sleep apnea (adult) (pediatric): Secondary | ICD-10-CM | POA: Diagnosis not present

## 2021-01-28 DIAGNOSIS — G4733 Obstructive sleep apnea (adult) (pediatric): Secondary | ICD-10-CM | POA: Diagnosis not present

## 2021-02-27 DIAGNOSIS — G4733 Obstructive sleep apnea (adult) (pediatric): Secondary | ICD-10-CM | POA: Diagnosis not present

## 2021-03-20 DIAGNOSIS — U071 COVID-19: Secondary | ICD-10-CM | POA: Diagnosis not present

## 2021-03-20 DIAGNOSIS — R112 Nausea with vomiting, unspecified: Secondary | ICD-10-CM | POA: Diagnosis not present

## 2021-03-20 DIAGNOSIS — R509 Fever, unspecified: Secondary | ICD-10-CM | POA: Diagnosis not present

## 2021-03-29 DIAGNOSIS — J40 Bronchitis, not specified as acute or chronic: Secondary | ICD-10-CM | POA: Diagnosis not present

## 2021-03-29 DIAGNOSIS — U071 COVID-19: Secondary | ICD-10-CM | POA: Diagnosis not present

## 2021-04-07 DIAGNOSIS — R5383 Other fatigue: Secondary | ICD-10-CM | POA: Diagnosis not present

## 2021-04-07 DIAGNOSIS — U099 Post covid-19 condition, unspecified: Secondary | ICD-10-CM | POA: Diagnosis not present

## 2021-04-07 DIAGNOSIS — R059 Cough, unspecified: Secondary | ICD-10-CM | POA: Diagnosis not present

## 2021-04-07 DIAGNOSIS — J40 Bronchitis, not specified as acute or chronic: Secondary | ICD-10-CM | POA: Diagnosis not present

## 2021-04-22 DIAGNOSIS — R0789 Other chest pain: Secondary | ICD-10-CM | POA: Diagnosis not present

## 2021-04-22 DIAGNOSIS — R059 Cough, unspecified: Secondary | ICD-10-CM | POA: Diagnosis not present

## 2021-04-22 DIAGNOSIS — Z8616 Personal history of COVID-19: Secondary | ICD-10-CM | POA: Diagnosis not present

## 2021-04-22 DIAGNOSIS — G4733 Obstructive sleep apnea (adult) (pediatric): Secondary | ICD-10-CM | POA: Diagnosis not present

## 2021-05-20 DIAGNOSIS — G4733 Obstructive sleep apnea (adult) (pediatric): Secondary | ICD-10-CM | POA: Diagnosis not present

## 2021-05-25 DIAGNOSIS — F3341 Major depressive disorder, recurrent, in partial remission: Secondary | ICD-10-CM | POA: Diagnosis not present

## 2021-05-25 DIAGNOSIS — B001 Herpesviral vesicular dermatitis: Secondary | ICD-10-CM | POA: Diagnosis not present

## 2021-06-20 DIAGNOSIS — G4733 Obstructive sleep apnea (adult) (pediatric): Secondary | ICD-10-CM | POA: Diagnosis not present

## 2021-09-21 DIAGNOSIS — M545 Low back pain, unspecified: Secondary | ICD-10-CM | POA: Diagnosis not present

## 2021-09-22 DIAGNOSIS — G4733 Obstructive sleep apnea (adult) (pediatric): Secondary | ICD-10-CM | POA: Diagnosis not present

## 2021-10-04 DIAGNOSIS — M545 Low back pain, unspecified: Secondary | ICD-10-CM | POA: Diagnosis not present

## 2021-10-11 DIAGNOSIS — Z125 Encounter for screening for malignant neoplasm of prostate: Secondary | ICD-10-CM | POA: Diagnosis not present

## 2021-10-11 DIAGNOSIS — Z0000 Encounter for general adult medical examination without abnormal findings: Secondary | ICD-10-CM | POA: Diagnosis not present

## 2021-10-11 DIAGNOSIS — E782 Mixed hyperlipidemia: Secondary | ICD-10-CM | POA: Diagnosis not present

## 2021-10-11 DIAGNOSIS — F3341 Major depressive disorder, recurrent, in partial remission: Secondary | ICD-10-CM | POA: Diagnosis not present

## 2021-10-17 DIAGNOSIS — M545 Low back pain, unspecified: Secondary | ICD-10-CM | POA: Diagnosis not present

## 2021-10-23 DIAGNOSIS — G4733 Obstructive sleep apnea (adult) (pediatric): Secondary | ICD-10-CM | POA: Diagnosis not present

## 2021-10-25 DIAGNOSIS — M545 Low back pain, unspecified: Secondary | ICD-10-CM | POA: Diagnosis not present

## 2021-11-22 IMAGING — CT CT RENAL STONE PROTOCOL
2 of 4 series · 16 of 46 positions shown, 18 images · non-contrast
Comparison: None.

CLINICAL DATA: Right-sided abdominal pain

EXAM:
CT ABDOMEN AND PELVIS WITHOUT CONTRAST
TECHNIQUE: Multidetector CT imaging of the abdomen and pelvis was performed
following the standard protocol without IV contrast.

[Series 2: axial st · axial · 0.83mm/px · z∈[-462,-12]mm · 13 of 104 slices shown, 15 images]
[im 7/104  soft-tissue]
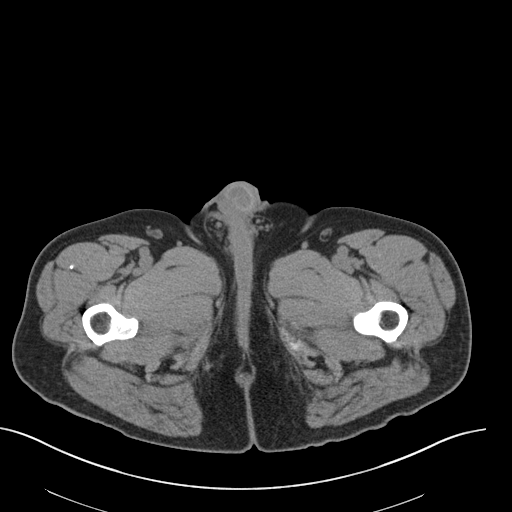
[im 7/104  bone]
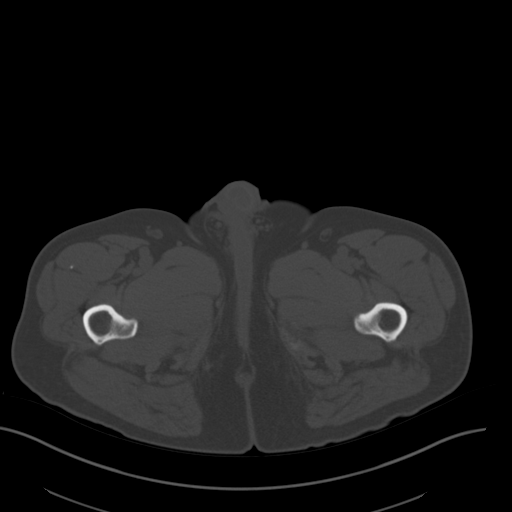
[im 13/104  soft-tissue]
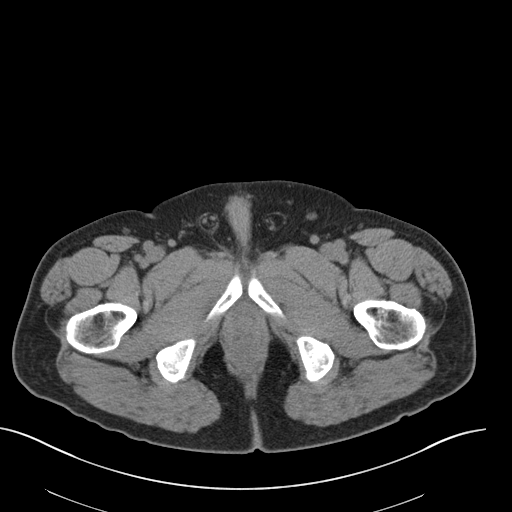
[im 25/104  soft-tissue]
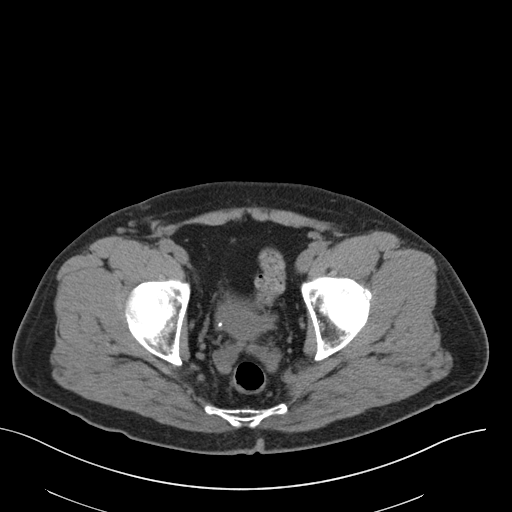
[im 31/104  soft-tissue]
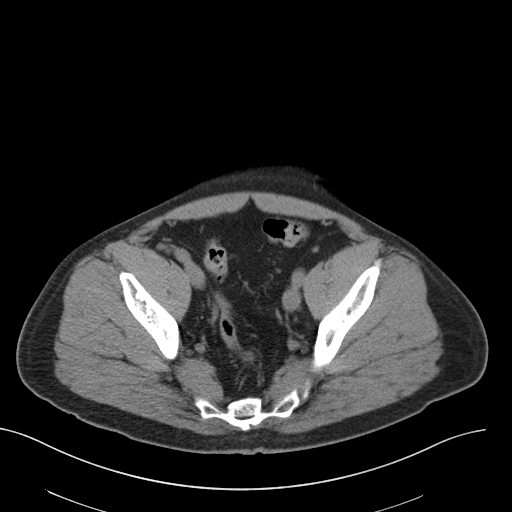
[im 37/104  soft-tissue]
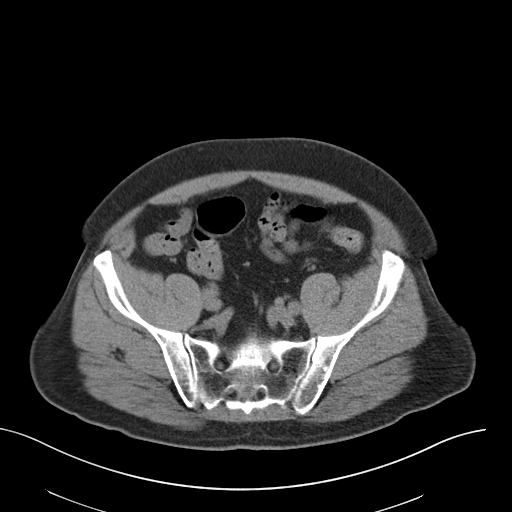
[im 43/104  soft-tissue]
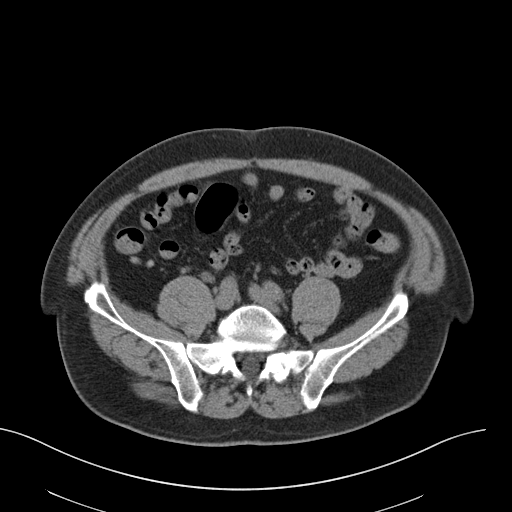
[im 55/104  soft-tissue]
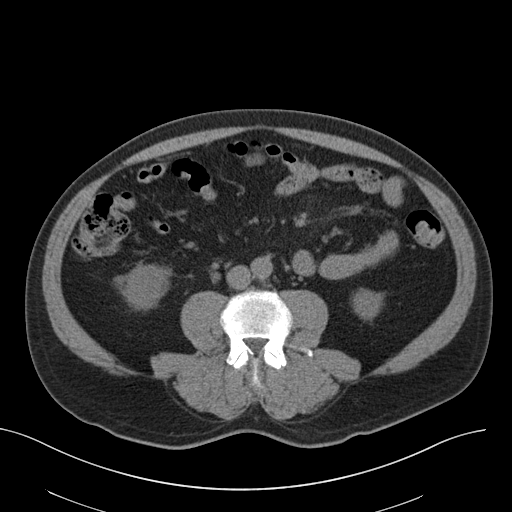
[im 61/104  soft-tissue]
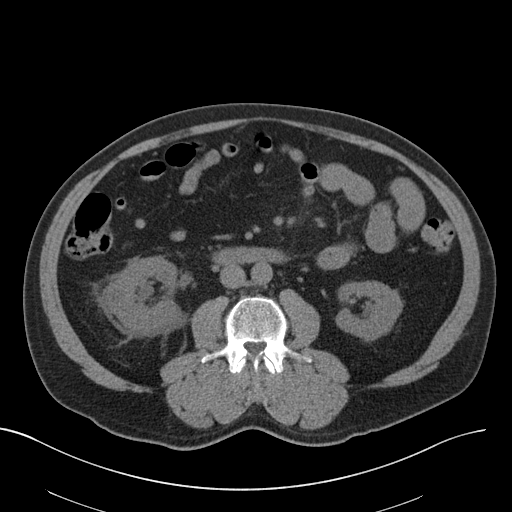
[im 67/104  soft-tissue]
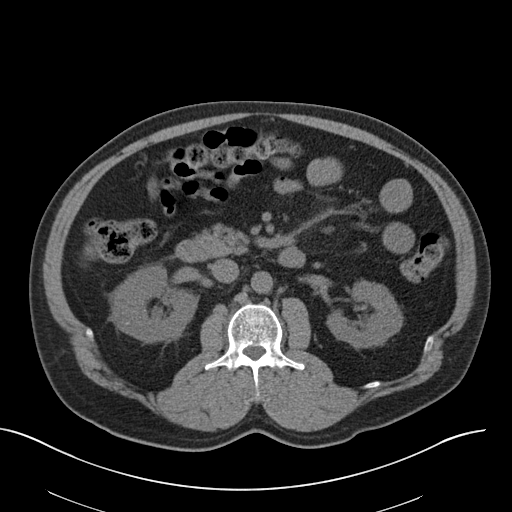
[im 67/104  bone]
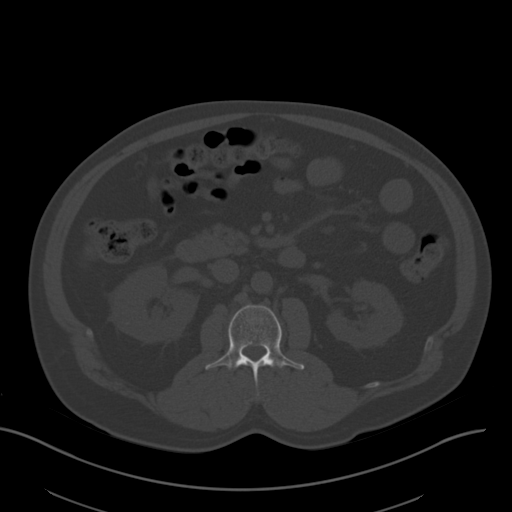
[im 73/104  soft-tissue]
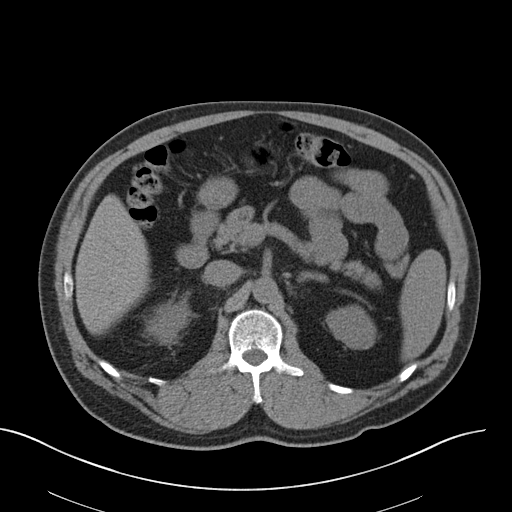
[im 79/104  soft-tissue]
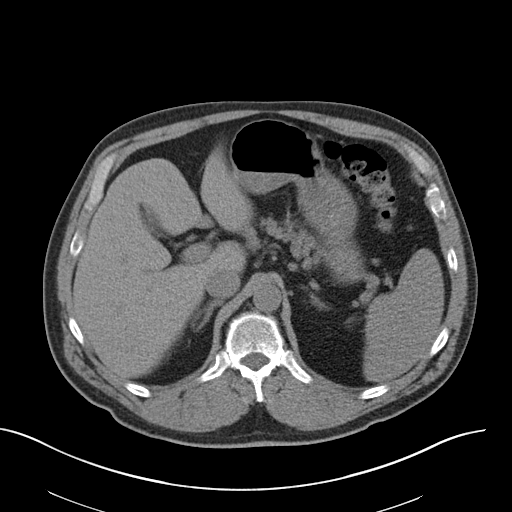
[im 91/104  soft-tissue]
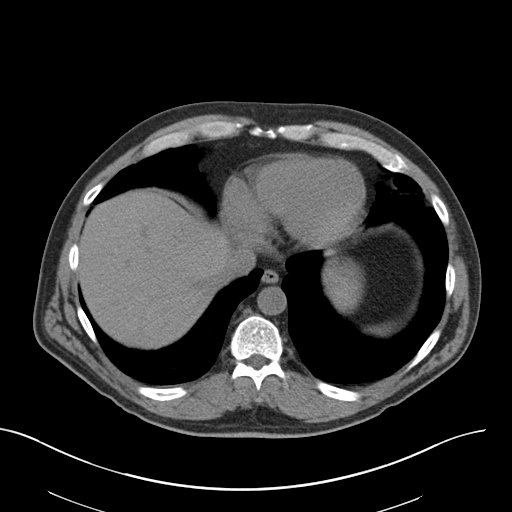
[im 97/104  soft-tissue]
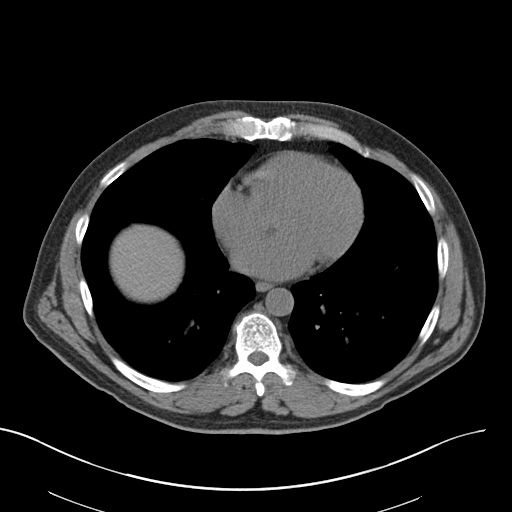

[Series 5: coronal · coronal · 0.77mm/px · 3 of 145 slices shown]
[im 49/145  soft-tissue]
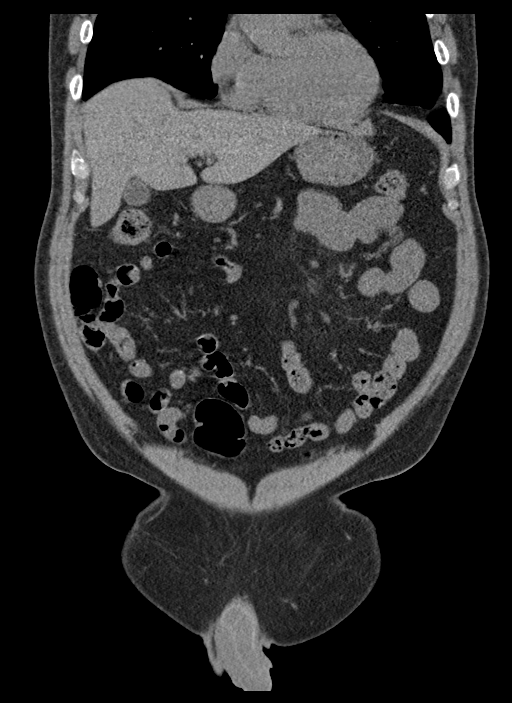
[im 65/145  soft-tissue]
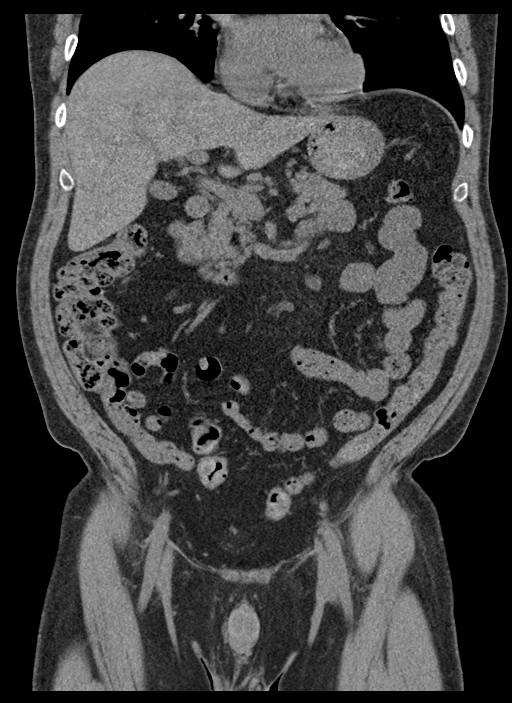
[im 81/145  soft-tissue]
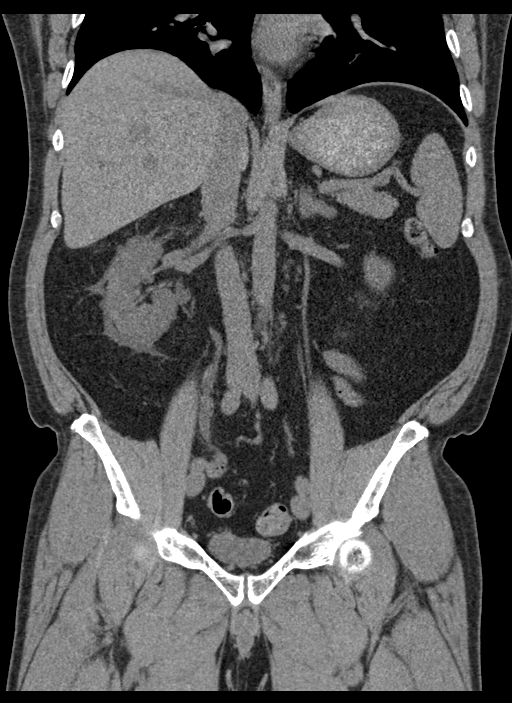

[16 of 46 positions shown; findings below may reference images not displayed]

FINDINGS: Lower chest: The visualized heart size within normal limits. No
pericardial fluid/thickening.

No hiatal hernia.

The visualized portions of the lungs are clear.

Hepatobiliary: Although limited due to the lack of intravenous
contrast, normal in appearance without gross focal abnormality. No
evidence of calcified gallstones or biliary ductal dilatation.

Pancreas:  Unremarkable.  No surrounding inflammatory changes.

Spleen: Normal in size. Although limited due to the lack of
intravenous contrast, normal in appearance.

Adrenals/Urinary Tract: Both adrenal glands appear normal. There is
mild right pelvicaliectasis and ureterectasis with significant
perinephric and periureteral stranding down to the level of
posterior right bladder where there is a 4 mm calculus present.
There is also a punctate calcifications seen in the upper pole of
the right kidney. No left-sided renal or ureteral calculi are seen.

Stomach/Bowel: The stomach, small bowel, and colon are normal in
appearance. No inflammatory changes or obstructive findings.
appendix is normal.

Vascular/Lymphatic: There are no enlarged abdominal or pelvic lymph
nodes. No significant gross vascular findings are present.

Reproductive: The prostate is unremarkable.

Other: No evidence of abdominal wall mass or hernia.

Musculoskeletal: No acute or significant osseous findings.
IMPRESSION: Mild right hydronephrosis with significant perinephric and
periureteral stranding to the level of the posterior right bladder
there is a 4 mm calculus.

Punctate right upper pole renal calculus.

## 2021-11-23 DIAGNOSIS — G4733 Obstructive sleep apnea (adult) (pediatric): Secondary | ICD-10-CM | POA: Diagnosis not present

## 2022-01-17 DIAGNOSIS — H9042 Sensorineural hearing loss, unilateral, left ear, with unrestricted hearing on the contralateral side: Secondary | ICD-10-CM | POA: Diagnosis not present

## 2022-01-23 DIAGNOSIS — G4733 Obstructive sleep apnea (adult) (pediatric): Secondary | ICD-10-CM | POA: Diagnosis not present

## 2022-01-27 DIAGNOSIS — N281 Cyst of kidney, acquired: Secondary | ICD-10-CM | POA: Diagnosis not present

## 2022-01-27 DIAGNOSIS — Z87442 Personal history of urinary calculi: Secondary | ICD-10-CM | POA: Diagnosis not present

## 2022-01-27 DIAGNOSIS — N200 Calculus of kidney: Secondary | ICD-10-CM | POA: Diagnosis not present

## 2022-02-08 DIAGNOSIS — N200 Calculus of kidney: Secondary | ICD-10-CM | POA: Diagnosis not present

## 2022-02-15 DIAGNOSIS — D485 Neoplasm of uncertain behavior of skin: Secondary | ICD-10-CM | POA: Diagnosis not present

## 2022-02-15 DIAGNOSIS — D1801 Hemangioma of skin and subcutaneous tissue: Secondary | ICD-10-CM | POA: Diagnosis not present

## 2022-02-15 DIAGNOSIS — D225 Melanocytic nevi of trunk: Secondary | ICD-10-CM | POA: Diagnosis not present

## 2022-02-15 DIAGNOSIS — L821 Other seborrheic keratosis: Secondary | ICD-10-CM | POA: Diagnosis not present

## 2022-02-15 DIAGNOSIS — L570 Actinic keratosis: Secondary | ICD-10-CM | POA: Diagnosis not present

## 2022-02-15 DIAGNOSIS — L578 Other skin changes due to chronic exposure to nonionizing radiation: Secondary | ICD-10-CM | POA: Diagnosis not present

## 2022-02-22 DIAGNOSIS — G4733 Obstructive sleep apnea (adult) (pediatric): Secondary | ICD-10-CM | POA: Diagnosis not present

## 2022-03-15 DIAGNOSIS — N200 Calculus of kidney: Secondary | ICD-10-CM | POA: Diagnosis not present

## 2022-03-25 DIAGNOSIS — G4733 Obstructive sleep apnea (adult) (pediatric): Secondary | ICD-10-CM | POA: Diagnosis not present

## 2022-04-25 DIAGNOSIS — L820 Inflamed seborrheic keratosis: Secondary | ICD-10-CM | POA: Diagnosis not present

## 2022-07-13 DIAGNOSIS — L820 Inflamed seborrheic keratosis: Secondary | ICD-10-CM | POA: Diagnosis not present

## 2022-07-13 DIAGNOSIS — L738 Other specified follicular disorders: Secondary | ICD-10-CM | POA: Diagnosis not present

## 2022-08-25 DIAGNOSIS — E782 Mixed hyperlipidemia: Secondary | ICD-10-CM | POA: Diagnosis not present

## 2022-08-25 DIAGNOSIS — Z0000 Encounter for general adult medical examination without abnormal findings: Secondary | ICD-10-CM | POA: Diagnosis not present

## 2022-08-25 DIAGNOSIS — Z125 Encounter for screening for malignant neoplasm of prostate: Secondary | ICD-10-CM | POA: Diagnosis not present

## 2022-08-25 DIAGNOSIS — G43009 Migraine without aura, not intractable, without status migrainosus: Secondary | ICD-10-CM | POA: Diagnosis not present

## 2022-08-29 DIAGNOSIS — H2513 Age-related nuclear cataract, bilateral: Secondary | ICD-10-CM | POA: Diagnosis not present

## 2022-08-29 DIAGNOSIS — H18503 Unspecified hereditary corneal dystrophies, bilateral: Secondary | ICD-10-CM | POA: Diagnosis not present

## 2022-08-29 DIAGNOSIS — H40053 Ocular hypertension, bilateral: Secondary | ICD-10-CM | POA: Diagnosis not present

## 2022-08-29 DIAGNOSIS — H5203 Hypermetropia, bilateral: Secondary | ICD-10-CM | POA: Diagnosis not present

## 2022-08-29 DIAGNOSIS — H524 Presbyopia: Secondary | ICD-10-CM | POA: Diagnosis not present

## 2022-08-30 DIAGNOSIS — E782 Mixed hyperlipidemia: Secondary | ICD-10-CM | POA: Diagnosis not present

## 2022-08-30 DIAGNOSIS — Z0000 Encounter for general adult medical examination without abnormal findings: Secondary | ICD-10-CM | POA: Diagnosis not present

## 2022-08-30 DIAGNOSIS — M545 Low back pain, unspecified: Secondary | ICD-10-CM | POA: Diagnosis not present

## 2022-09-01 DIAGNOSIS — M545 Low back pain, unspecified: Secondary | ICD-10-CM | POA: Diagnosis not present

## 2022-09-13 DIAGNOSIS — M545 Low back pain, unspecified: Secondary | ICD-10-CM | POA: Diagnosis not present

## 2022-09-18 DIAGNOSIS — M545 Low back pain, unspecified: Secondary | ICD-10-CM | POA: Diagnosis not present

## 2022-10-02 DIAGNOSIS — M545 Low back pain, unspecified: Secondary | ICD-10-CM | POA: Diagnosis not present

## 2022-10-02 DIAGNOSIS — R509 Fever, unspecified: Secondary | ICD-10-CM | POA: Diagnosis not present

## 2022-10-02 DIAGNOSIS — J40 Bronchitis, not specified as acute or chronic: Secondary | ICD-10-CM | POA: Diagnosis not present

## 2022-10-07 DIAGNOSIS — J189 Pneumonia, unspecified organism: Secondary | ICD-10-CM | POA: Diagnosis not present

## 2022-10-07 DIAGNOSIS — R0609 Other forms of dyspnea: Secondary | ICD-10-CM | POA: Diagnosis not present

## 2022-10-07 DIAGNOSIS — R053 Chronic cough: Secondary | ICD-10-CM | POA: Diagnosis not present

## 2022-10-07 DIAGNOSIS — R509 Fever, unspecified: Secondary | ICD-10-CM | POA: Diagnosis not present

## 2022-11-02 DIAGNOSIS — R059 Cough, unspecified: Secondary | ICD-10-CM | POA: Diagnosis not present

## 2022-11-02 DIAGNOSIS — G43009 Migraine without aura, not intractable, without status migrainosus: Secondary | ICD-10-CM | POA: Diagnosis not present

## 2022-11-15 DIAGNOSIS — G4733 Obstructive sleep apnea (adult) (pediatric): Secondary | ICD-10-CM | POA: Diagnosis not present

## 2022-12-16 DIAGNOSIS — G4733 Obstructive sleep apnea (adult) (pediatric): Secondary | ICD-10-CM | POA: Diagnosis not present

## 2023-01-15 DIAGNOSIS — G4733 Obstructive sleep apnea (adult) (pediatric): Secondary | ICD-10-CM | POA: Diagnosis not present

## 2023-03-09 DIAGNOSIS — Z87442 Personal history of urinary calculi: Secondary | ICD-10-CM | POA: Diagnosis not present

## 2023-03-09 DIAGNOSIS — N281 Cyst of kidney, acquired: Secondary | ICD-10-CM | POA: Diagnosis not present

## 2023-03-11 DIAGNOSIS — J029 Acute pharyngitis, unspecified: Secondary | ICD-10-CM | POA: Diagnosis not present

## 2023-03-11 DIAGNOSIS — Z03818 Encounter for observation for suspected exposure to other biological agents ruled out: Secondary | ICD-10-CM | POA: Diagnosis not present

## 2023-03-11 DIAGNOSIS — J069 Acute upper respiratory infection, unspecified: Secondary | ICD-10-CM | POA: Diagnosis not present

## 2023-03-11 DIAGNOSIS — R051 Acute cough: Secondary | ICD-10-CM | POA: Diagnosis not present

## 2023-03-28 DIAGNOSIS — G4733 Obstructive sleep apnea (adult) (pediatric): Secondary | ICD-10-CM | POA: Diagnosis not present

## 2023-04-28 DIAGNOSIS — G4733 Obstructive sleep apnea (adult) (pediatric): Secondary | ICD-10-CM | POA: Diagnosis not present

## 2023-05-08 DIAGNOSIS — G4700 Insomnia, unspecified: Secondary | ICD-10-CM | POA: Diagnosis not present

## 2023-05-08 DIAGNOSIS — E291 Testicular hypofunction: Secondary | ICD-10-CM | POA: Diagnosis not present

## 2023-05-08 DIAGNOSIS — E669 Obesity, unspecified: Secondary | ICD-10-CM | POA: Diagnosis not present

## 2023-05-09 DIAGNOSIS — E291 Testicular hypofunction: Secondary | ICD-10-CM | POA: Diagnosis not present

## 2023-05-15 DIAGNOSIS — E291 Testicular hypofunction: Secondary | ICD-10-CM | POA: Diagnosis not present

## 2023-05-26 DIAGNOSIS — G4733 Obstructive sleep apnea (adult) (pediatric): Secondary | ICD-10-CM | POA: Diagnosis not present

## 2023-06-01 DIAGNOSIS — E291 Testicular hypofunction: Secondary | ICD-10-CM | POA: Diagnosis not present

## 2023-06-08 DIAGNOSIS — E291 Testicular hypofunction: Secondary | ICD-10-CM | POA: Diagnosis not present

## 2023-06-15 DIAGNOSIS — E291 Testicular hypofunction: Secondary | ICD-10-CM | POA: Diagnosis not present

## 2023-06-25 DIAGNOSIS — E291 Testicular hypofunction: Secondary | ICD-10-CM | POA: Diagnosis not present

## 2023-07-25 DIAGNOSIS — M545 Low back pain, unspecified: Secondary | ICD-10-CM | POA: Diagnosis not present

## 2023-07-27 DIAGNOSIS — M545 Low back pain, unspecified: Secondary | ICD-10-CM | POA: Diagnosis not present

## 2023-07-30 DIAGNOSIS — M545 Low back pain, unspecified: Secondary | ICD-10-CM | POA: Diagnosis not present

## 2023-08-02 DIAGNOSIS — M545 Low back pain, unspecified: Secondary | ICD-10-CM | POA: Diagnosis not present

## 2023-08-10 DIAGNOSIS — M545 Low back pain, unspecified: Secondary | ICD-10-CM | POA: Diagnosis not present

## 2023-08-20 DIAGNOSIS — G4733 Obstructive sleep apnea (adult) (pediatric): Secondary | ICD-10-CM | POA: Diagnosis not present

## 2023-08-21 DIAGNOSIS — M545 Low back pain, unspecified: Secondary | ICD-10-CM | POA: Diagnosis not present

## 2023-08-24 DIAGNOSIS — M545 Low back pain, unspecified: Secondary | ICD-10-CM | POA: Diagnosis not present

## 2023-08-29 DIAGNOSIS — G43009 Migraine without aura, not intractable, without status migrainosus: Secondary | ICD-10-CM | POA: Diagnosis not present

## 2023-08-29 DIAGNOSIS — Z0000 Encounter for general adult medical examination without abnormal findings: Secondary | ICD-10-CM | POA: Diagnosis not present

## 2023-08-29 DIAGNOSIS — E291 Testicular hypofunction: Secondary | ICD-10-CM | POA: Diagnosis not present

## 2023-08-29 DIAGNOSIS — E782 Mixed hyperlipidemia: Secondary | ICD-10-CM | POA: Diagnosis not present

## 2023-08-29 DIAGNOSIS — G4700 Insomnia, unspecified: Secondary | ICD-10-CM | POA: Diagnosis not present

## 2023-08-29 DIAGNOSIS — Z6830 Body mass index (BMI) 30.0-30.9, adult: Secondary | ICD-10-CM | POA: Diagnosis not present

## 2023-08-31 DIAGNOSIS — H2513 Age-related nuclear cataract, bilateral: Secondary | ICD-10-CM | POA: Diagnosis not present

## 2023-08-31 DIAGNOSIS — H40053 Ocular hypertension, bilateral: Secondary | ICD-10-CM | POA: Diagnosis not present

## 2023-08-31 DIAGNOSIS — H18593 Other hereditary corneal dystrophies, bilateral: Secondary | ICD-10-CM | POA: Diagnosis not present

## 2023-08-31 DIAGNOSIS — H524 Presbyopia: Secondary | ICD-10-CM | POA: Diagnosis not present

## 2023-08-31 DIAGNOSIS — H5203 Hypermetropia, bilateral: Secondary | ICD-10-CM | POA: Diagnosis not present

## 2023-08-31 DIAGNOSIS — H52203 Unspecified astigmatism, bilateral: Secondary | ICD-10-CM | POA: Diagnosis not present

## 2023-09-19 DIAGNOSIS — G4733 Obstructive sleep apnea (adult) (pediatric): Secondary | ICD-10-CM | POA: Diagnosis not present

## 2023-10-20 DIAGNOSIS — H8113 Benign paroxysmal vertigo, bilateral: Secondary | ICD-10-CM | POA: Diagnosis not present

## 2023-10-20 DIAGNOSIS — H6993 Unspecified Eustachian tube disorder, bilateral: Secondary | ICD-10-CM | POA: Diagnosis not present

## 2023-10-20 DIAGNOSIS — G4733 Obstructive sleep apnea (adult) (pediatric): Secondary | ICD-10-CM | POA: Diagnosis not present

## 2023-10-20 DIAGNOSIS — R0981 Nasal congestion: Secondary | ICD-10-CM | POA: Diagnosis not present

## 2023-11-06 DIAGNOSIS — H8113 Benign paroxysmal vertigo, bilateral: Secondary | ICD-10-CM | POA: Diagnosis not present

## 2023-11-06 DIAGNOSIS — H81392 Other peripheral vertigo, left ear: Secondary | ICD-10-CM | POA: Diagnosis not present

## 2023-11-06 DIAGNOSIS — R42 Dizziness and giddiness: Secondary | ICD-10-CM | POA: Diagnosis not present

## 2023-11-07 DIAGNOSIS — D2271 Melanocytic nevi of right lower limb, including hip: Secondary | ICD-10-CM | POA: Diagnosis not present

## 2023-11-07 DIAGNOSIS — H8113 Benign paroxysmal vertigo, bilateral: Secondary | ICD-10-CM | POA: Diagnosis not present

## 2023-11-07 DIAGNOSIS — R42 Dizziness and giddiness: Secondary | ICD-10-CM | POA: Diagnosis not present

## 2023-11-07 DIAGNOSIS — D485 Neoplasm of uncertain behavior of skin: Secondary | ICD-10-CM | POA: Diagnosis not present

## 2023-11-07 DIAGNOSIS — H81392 Other peripheral vertigo, left ear: Secondary | ICD-10-CM | POA: Diagnosis not present

## 2023-11-07 DIAGNOSIS — D225 Melanocytic nevi of trunk: Secondary | ICD-10-CM | POA: Diagnosis not present

## 2023-11-07 DIAGNOSIS — L570 Actinic keratosis: Secondary | ICD-10-CM | POA: Diagnosis not present

## 2023-11-20 DIAGNOSIS — H8113 Benign paroxysmal vertigo, bilateral: Secondary | ICD-10-CM | POA: Diagnosis not present

## 2023-11-20 DIAGNOSIS — H81392 Other peripheral vertigo, left ear: Secondary | ICD-10-CM | POA: Diagnosis not present

## 2023-11-20 DIAGNOSIS — R42 Dizziness and giddiness: Secondary | ICD-10-CM | POA: Diagnosis not present

## 2023-12-20 DIAGNOSIS — L905 Scar conditions and fibrosis of skin: Secondary | ICD-10-CM | POA: Diagnosis not present

## 2023-12-21 DIAGNOSIS — H81392 Other peripheral vertigo, left ear: Secondary | ICD-10-CM | POA: Diagnosis not present

## 2023-12-21 DIAGNOSIS — H8113 Benign paroxysmal vertigo, bilateral: Secondary | ICD-10-CM | POA: Diagnosis not present

## 2023-12-31 DIAGNOSIS — H8113 Benign paroxysmal vertigo, bilateral: Secondary | ICD-10-CM | POA: Diagnosis not present

## 2023-12-31 DIAGNOSIS — H81392 Other peripheral vertigo, left ear: Secondary | ICD-10-CM | POA: Diagnosis not present

## 2024-01-07 DIAGNOSIS — H81392 Other peripheral vertigo, left ear: Secondary | ICD-10-CM | POA: Diagnosis not present

## 2024-01-07 DIAGNOSIS — H8113 Benign paroxysmal vertigo, bilateral: Secondary | ICD-10-CM | POA: Diagnosis not present

## 2024-01-09 DIAGNOSIS — H8113 Benign paroxysmal vertigo, bilateral: Secondary | ICD-10-CM | POA: Diagnosis not present

## 2024-01-09 DIAGNOSIS — H81392 Other peripheral vertigo, left ear: Secondary | ICD-10-CM | POA: Diagnosis not present

## 2024-01-11 DIAGNOSIS — H81392 Other peripheral vertigo, left ear: Secondary | ICD-10-CM | POA: Diagnosis not present

## 2024-01-11 DIAGNOSIS — H8113 Benign paroxysmal vertigo, bilateral: Secondary | ICD-10-CM | POA: Diagnosis not present

## 2024-01-18 DIAGNOSIS — H8113 Benign paroxysmal vertigo, bilateral: Secondary | ICD-10-CM | POA: Diagnosis not present

## 2024-01-18 DIAGNOSIS — H81392 Other peripheral vertigo, left ear: Secondary | ICD-10-CM | POA: Diagnosis not present

## 2024-02-01 DIAGNOSIS — H8113 Benign paroxysmal vertigo, bilateral: Secondary | ICD-10-CM | POA: Diagnosis not present

## 2024-02-01 DIAGNOSIS — H81392 Other peripheral vertigo, left ear: Secondary | ICD-10-CM | POA: Diagnosis not present

## 2024-02-19 DIAGNOSIS — H81392 Other peripheral vertigo, left ear: Secondary | ICD-10-CM | POA: Diagnosis not present

## 2024-02-19 DIAGNOSIS — H8113 Benign paroxysmal vertigo, bilateral: Secondary | ICD-10-CM | POA: Diagnosis not present

## 2024-02-21 DIAGNOSIS — H81392 Other peripheral vertigo, left ear: Secondary | ICD-10-CM | POA: Diagnosis not present

## 2024-02-21 DIAGNOSIS — H8113 Benign paroxysmal vertigo, bilateral: Secondary | ICD-10-CM | POA: Diagnosis not present

## 2024-03-03 ENCOUNTER — Ambulatory Visit: Admitting: Podiatry

## 2024-03-03 ENCOUNTER — Encounter: Payer: Self-pay | Admitting: Podiatry

## 2024-03-03 ENCOUNTER — Ambulatory Visit: Payer: Self-pay | Admitting: Podiatry

## 2024-03-03 DIAGNOSIS — M722 Plantar fascial fibromatosis: Secondary | ICD-10-CM

## 2024-03-03 DIAGNOSIS — H8113 Benign paroxysmal vertigo, bilateral: Secondary | ICD-10-CM | POA: Diagnosis not present

## 2024-03-03 DIAGNOSIS — N281 Cyst of kidney, acquired: Secondary | ICD-10-CM | POA: Diagnosis not present

## 2024-03-03 DIAGNOSIS — N200 Calculus of kidney: Secondary | ICD-10-CM | POA: Diagnosis not present

## 2024-03-03 DIAGNOSIS — H81392 Other peripheral vertigo, left ear: Secondary | ICD-10-CM | POA: Diagnosis not present

## 2024-03-03 NOTE — Progress Notes (Signed)
 Today with complaint of pain along the arches of the feet.  He is using orthotics and these keep it in [place] pretty well.  Still gets flares now and then.  Has to be careful what shoes he wears.  Would like a new pair of orthotics and refurbish his old ones.   Physical exam:  General appearance: Pleasant, and in no acute distress. AOx3.  Vascular: Pedal pulses: DP 2/4 bilaterally, PT 2/4 bilaterally.  Minimal edema lower legs bilaterally. Capillary fill time intact bilaterally.  Neurological: Light touch intact feet bilaterally.  Normal Achilles reflex bilaterally.  No clonus or spasticity noted.  Negative Tinel sign tarsal tunnel and porta pedis bilateral  Dermatologic:   Skin normal temperature bilaterally.  Skin normal color, tone, and texture bilaterally.   Musculoskeletal: Tenderness along the middle one third of the plantar fascial band bilaterally.  No fibromas noted.  No tenderness of the calcaneus.    Diagnosis: 1.  Plantar fasciitis bilaterally  Plan: -New patient office visit for evaluation and management level 3 - Needs new orthotics for treatment of the plantar fasciitis.  Also needs his old pair refurbished.  So he has a backup pair. Discussed proper shoes with good support.  Gave him several suggestions.  Return will schedule with Dr. Mellody today to get casted for orthotics and set up for refurbishment of the old orthotics

## 2024-03-04 NOTE — Progress Notes (Signed)
 Visit:  ORTHOTIC SCAN/ EVALUATION  Patient presented for evaluation/ scan for custom molded foot orthotics.  Patient will benefit from custom foot orthotics to provide total contact to bilateral medial longitudinal arches to help balance and distribute body weight more evenly.  Thus reducing plantar pressure and pain.   Orthotic will encourage forefoot and rearfoot alignment.    Patient was scanned today with [facility] scanner.    Orthotics are ordered.-  sport, full length neoprene 2 pair of old orthotics are sent for a refurbishment.   Signature obtained for notification of pricing/ fees for the device.  When the orthotic is ready for pick up, will call to make an appointment for a fitting.    Dailon Sheeran, DPM

## 2024-03-05 DIAGNOSIS — H8113 Benign paroxysmal vertigo, bilateral: Secondary | ICD-10-CM | POA: Diagnosis not present

## 2024-03-05 DIAGNOSIS — H81392 Other peripheral vertigo, left ear: Secondary | ICD-10-CM | POA: Diagnosis not present

## 2024-03-06 DIAGNOSIS — I959 Hypotension, unspecified: Secondary | ICD-10-CM | POA: Diagnosis not present

## 2024-03-06 DIAGNOSIS — Z6832 Body mass index (BMI) 32.0-32.9, adult: Secondary | ICD-10-CM | POA: Diagnosis not present

## 2024-03-10 DIAGNOSIS — H81392 Other peripheral vertigo, left ear: Secondary | ICD-10-CM | POA: Diagnosis not present

## 2024-03-10 DIAGNOSIS — H8113 Benign paroxysmal vertigo, bilateral: Secondary | ICD-10-CM | POA: Diagnosis not present

## 2024-03-11 DIAGNOSIS — G4733 Obstructive sleep apnea (adult) (pediatric): Secondary | ICD-10-CM | POA: Diagnosis not present

## 2024-03-17 DIAGNOSIS — H81392 Other peripheral vertigo, left ear: Secondary | ICD-10-CM | POA: Diagnosis not present

## 2024-03-17 DIAGNOSIS — H8113 Benign paroxysmal vertigo, bilateral: Secondary | ICD-10-CM | POA: Diagnosis not present

## 2024-03-18 ENCOUNTER — Other Ambulatory Visit (HOSPITAL_COMMUNITY): Payer: Self-pay | Admitting: Family Medicine

## 2024-03-18 DIAGNOSIS — I959 Hypotension, unspecified: Secondary | ICD-10-CM

## 2024-03-19 ENCOUNTER — Ambulatory Visit (HOSPITAL_COMMUNITY)
Admission: RE | Admit: 2024-03-19 | Discharge: 2024-03-19 | Disposition: A | Discharge status: Home / Self Care | Source: Ambulatory Visit | Attending: Family Medicine | Admitting: Family Medicine

## 2024-03-19 DIAGNOSIS — I340 Nonrheumatic mitral (valve) insufficiency: Secondary | ICD-10-CM | POA: Insufficient documentation

## 2024-03-19 DIAGNOSIS — I959 Hypotension, unspecified: Secondary | ICD-10-CM | POA: Insufficient documentation

## 2024-03-19 LAB — ECHOCARDIOGRAM COMPLETE
AR max vel: 1.96 cm2
AV Area VTI: 1.8 cm2
AV Area mean vel: 1.77 cm2
AV Mean grad: 5 mmHg
AV Peak grad: 10.4 mmHg
Ao pk vel: 1.61 m/s
Area-P 1/2: 4.21 cm2
Calc EF: 65.4 %
S' Lateral: 3 cm
Single Plane A2C EF: 66.3 %
Single Plane A4C EF: 65.3 %

## 2024-04-04 ENCOUNTER — Telehealth: Payer: Self-pay | Admitting: Podiatry

## 2024-04-04 NOTE — Telephone Encounter (Signed)
 Spoke with Patient on 04/04/24 at 11:19am to [facility]

## 2024-04-09 ENCOUNTER — Ambulatory Visit (INDEPENDENT_AMBULATORY_CARE_PROVIDER_SITE_OTHER): Admitting: Podiatrist

## 2024-04-09 DIAGNOSIS — M722 Plantar fascial fibromatosis: Secondary | ICD-10-CM

## 2024-04-09 NOTE — Progress Notes (Signed)
 Patient presents to pick up orthotics.  They were tried in his shoes and the arches are not high enough compared to his other orthotics he has had in the past.    I tried to elevate the arches but ultimately decided to re order him a pair in a different device style to hug the arch and give him support.  Re scanned and accommodative device with arch hug ordered.    I will rush the orthotics and hope to dispense next week. He also has 2 pair of orthotics being refurbished and I am calling [employer] today to get an estimated ship date for those.
